# Patient Record
Sex: Male | Born: 1990 | Race: Black or African American | Hispanic: No | Marital: Single | Smoking: Never smoker
Health system: Southern US, Community
[De-identification: ages and names within clinical notes are randomized; demographics above are authoritative.]

## PROBLEM LIST (undated history)

## (undated) DIAGNOSIS — S62309A Unspecified fracture of unspecified metacarpal bone, initial encounter for closed fracture: Secondary | ICD-10-CM

---
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
## 2010-05-12 LAB — RAPID STREP SCREEN (MED CTR MEBANE ONLY): Streptococcus, Group A Screen (Direct): NEGATIVE

## 2011-02-11 DIAGNOSIS — S62309A Unspecified fracture of unspecified metacarpal bone, initial encounter for closed fracture: Secondary | ICD-10-CM

## 2011-02-11 HISTORY — DX: Unspecified fracture of unspecified metacarpal bone, initial encounter for closed fracture: S62.309A

## 2011-02-12 HISTORY — PX: ELBOW SURGERY: SHX618

## 2011-02-24 ENCOUNTER — Other Ambulatory Visit: Payer: Self-pay | Admitting: Orthopedic Surgery

## 2011-02-27 ENCOUNTER — Encounter (HOSPITAL_BASED_OUTPATIENT_CLINIC_OR_DEPARTMENT_OTHER): Payer: Self-pay | Admitting: *Deleted

## 2011-03-01 ENCOUNTER — Encounter (HOSPITAL_BASED_OUTPATIENT_CLINIC_OR_DEPARTMENT_OTHER): Payer: Self-pay | Admitting: *Deleted

## 2011-03-01 ENCOUNTER — Ambulatory Visit (HOSPITAL_BASED_OUTPATIENT_CLINIC_OR_DEPARTMENT_OTHER)
Admission: RE | Admit: 2011-03-01 | Discharge: 2011-03-01 | Disposition: A | Discharge status: Home / Self Care | Payer: BC Managed Care – PPO | Source: Ambulatory Visit | Attending: Orthopedic Surgery | Admitting: Orthopedic Surgery

## 2011-03-01 ENCOUNTER — Encounter (HOSPITAL_BASED_OUTPATIENT_CLINIC_OR_DEPARTMENT_OTHER)
Admission: RE | Disposition: A | Discharge status: Home / Self Care | Payer: Self-pay | Source: Ambulatory Visit | Attending: Orthopedic Surgery

## 2011-03-01 ENCOUNTER — Ambulatory Visit (HOSPITAL_BASED_OUTPATIENT_CLINIC_OR_DEPARTMENT_OTHER): Payer: BC Managed Care – PPO | Admitting: *Deleted

## 2011-03-01 DIAGNOSIS — S42493A Other displaced fracture of lower end of unspecified humerus, initial encounter for closed fracture: Secondary | ICD-10-CM | POA: Insufficient documentation

## 2011-03-01 DIAGNOSIS — S62309A Unspecified fracture of unspecified metacarpal bone, initial encounter for closed fracture: Secondary | ICD-10-CM | POA: Insufficient documentation

## 2011-03-01 DIAGNOSIS — S52123A Displaced fracture of head of unspecified radius, initial encounter for closed fracture: Secondary | ICD-10-CM | POA: Insufficient documentation

## 2011-03-01 HISTORY — PX: ORIF FINGER FRACTURE: SHX2122

## 2011-03-01 HISTORY — DX: Unspecified fracture of unspecified metacarpal bone, initial encounter for closed fracture: S62.309A

## 2011-03-01 LAB — POCT HEMOGLOBIN-HEMACUE: Hemoglobin: 11.7 g/dL — ABNORMAL LOW (ref 13.0–17.0)

## 2011-03-01 SURGERY — OPEN REDUCTION INTERNAL FIXATION (ORIF) METACARPAL (FINGER) FRACTURE
Anesthesia: General | Site: Hand | Laterality: Left | Wound class: Clean

## 2011-03-01 MED ORDER — FENTANYL CITRATE 0.05 MG/ML IJ SOLN: 25.0000 ug | INTRAMUSCULAR | Status: DC | PRN

## 2011-03-01 MED ORDER — FENTANYL CITRATE 0.05 MG/ML IJ SOLN
50.0000 ug | INTRAMUSCULAR | Status: DC | PRN
  Administered 2011-03-01: 100 ug via INTRAVENOUS

## 2011-03-01 MED ORDER — LACTATED RINGERS IV SOLN
INTRAVENOUS | Status: DC
Start: 2011-03-01 — End: 2011-03-01
  Administered 2011-03-01 (×2): via INTRAVENOUS

## 2011-03-01 MED ORDER — BUPIVACAINE HCL (PF) 0.5 % IJ SOLN
INTRAMUSCULAR | Status: DC | PRN
  Administered 2011-03-01: 15 mL

## 2011-03-01 MED ORDER — OXYCODONE-ACETAMINOPHEN 5-325 MG PO TABS: 1.0000 | ORAL_TABLET | ORAL | Status: DC | PRN

## 2011-03-01 MED ORDER — CHLORHEXIDINE GLUCONATE 4 % EX LIQD: 60.0000 mL | Freq: Once | CUTANEOUS | Status: DC

## 2011-03-01 MED ORDER — METOCLOPRAMIDE HCL 5 MG/ML IJ SOLN: 10.0000 mg | Freq: Once | INTRAMUSCULAR | Status: DC | PRN

## 2011-03-01 MED ORDER — ONDANSETRON HCL 4 MG/2ML IJ SOLN
INTRAMUSCULAR | Status: DC | PRN
  Administered 2011-03-01: 4 mg via INTRAVENOUS

## 2011-03-01 MED ORDER — MIDAZOLAM HCL 2 MG/2ML IJ SOLN
0.5000 mg | INTRAMUSCULAR | Status: DC | PRN
  Administered 2011-03-01: 2 mg via INTRAVENOUS

## 2011-03-01 MED ORDER — PROPOFOL 10 MG/ML IV EMUL
INTRAVENOUS | Status: DC | PRN
  Administered 2011-03-01: 250 mg via INTRAVENOUS

## 2011-03-01 MED ORDER — MORPHINE SULFATE 2 MG/ML IJ SOLN: 0.0500 mg/kg | INTRAMUSCULAR | Status: DC | PRN

## 2011-03-01 MED ORDER — DEXAMETHASONE SODIUM PHOSPHATE 10 MG/ML IJ SOLN
INTRAMUSCULAR | Status: DC | PRN
  Administered 2011-03-01: 10 mg via INTRAVENOUS

## 2011-03-01 MED ORDER — LIDOCAINE-EPINEPHRINE 1.5-1:200000 % IJ SOLN
INTRAMUSCULAR | Status: DC | PRN
  Administered 2011-03-01: 15 mL via INTRADERMAL

## 2011-03-01 MED ORDER — LIDOCAINE HCL (PF) 1 % IJ SOLN
INTRAMUSCULAR | Status: DC | PRN
  Administered 2011-03-01: 2 mL

## 2011-03-01 MED ORDER — CEFAZOLIN SODIUM 1-5 GM-% IV SOLN
1.0000 g | INTRAVENOUS | Status: AC
  Administered 2011-03-01: 1 g via INTRAVENOUS

## 2011-03-01 SURGICAL SUPPLY — 83 items
1.1MM DRILL BIT QUICK CONNECT ×2 IMPLANT
APL SKNCLS STERI-STRIP NONHPOA (GAUZE/BANDAGES/DRESSINGS)
BANDAGE ACE 4 STERILE (GAUZE/BANDAGES/DRESSINGS) ×2 IMPLANT
BANDAGE ELASTIC 3 VELCRO ST LF (GAUZE/BANDAGES/DRESSINGS) ×2 IMPLANT
BANDAGE ELASTIC 4 VELCRO ST LF (GAUZE/BANDAGES/DRESSINGS) ×4 IMPLANT
BANDAGE GAUZE ELAST BULKY 4 IN (GAUZE/BANDAGES/DRESSINGS) ×4 IMPLANT
BENZOIN TINCTURE PRP APPL 2/3 (GAUZE/BANDAGES/DRESSINGS) IMPLANT
BIT DRILL MINI LNG ACUTRAK 2 (BIT) ×1 IMPLANT
BLADE SURG 15 STRL LF DISP TIS (BLADE) ×1 IMPLANT
BLADE SURG 15 STRL SS (BLADE) ×2
BNDG CMPR 9X4 STRL LF SNTH (GAUZE/BANDAGES/DRESSINGS) ×1
BNDG CMPR MD 5X2 ELC HKLP STRL (GAUZE/BANDAGES/DRESSINGS)
BNDG ELASTIC 2 VLCR STRL LF (GAUZE/BANDAGES/DRESSINGS) IMPLANT
BNDG ESMARK 4X9 LF (GAUZE/BANDAGES/DRESSINGS) ×2 IMPLANT
BONE CHIP PRESERV 5CC PCAN5 (Bone Implant) ×2 IMPLANT
CANISTER SUCTION 1200CC (MISCELLANEOUS) ×2 IMPLANT
CLOTH BEACON ORANGE TIMEOUT ST (SAFETY) ×2 IMPLANT
CORDS BIPOLAR (ELECTRODE) ×2 IMPLANT
COVER SURGICAL LIGHT HANDLE (MISCELLANEOUS) ×4 IMPLANT
COVER TABLE BACK 60X90 (DRAPES) ×2 IMPLANT
CUFF TOURNIQUET SINGLE 18IN (TOURNIQUET CUFF) IMPLANT
CUFF TOURNIQUET SINGLE 24IN (TOURNIQUET CUFF) ×2 IMPLANT
DECANTER SPIKE VIAL GLASS SM (MISCELLANEOUS) IMPLANT
DRAPE EXTREMITY T 121X128X90 (DRAPE) ×2 IMPLANT
DRAPE OEC MINIVIEW 54X84 (DRAPES) ×2 IMPLANT
DRAPE SURG 17X23 STRL (DRAPES) ×2 IMPLANT
DRILL MINI LNG ACUTRAK 2 (BIT) ×2
DURAPREP 26ML APPLICATOR (WOUND CARE) ×2 IMPLANT
GAUZE SPONGE 4X4 16PLY XRAY LF (GAUZE/BANDAGES/DRESSINGS) ×2 IMPLANT
GAUZE XEROFORM 1X8 LF (GAUZE/BANDAGES/DRESSINGS) ×2 IMPLANT
GLOVE BIO SURGEON STRL SZ 6.5 (GLOVE) ×4 IMPLANT
GLOVE BIO SURGEON STRL SZ8.5 (GLOVE) ×4 IMPLANT
GLOVE BIOGEL PI IND STRL 7.0 (GLOVE) ×1 IMPLANT
GLOVE BIOGEL PI INDICATOR 7.0 (GLOVE) ×1
GLOVE INDICATOR 7.0 STRL GRN (GLOVE) ×2 IMPLANT
GOWN PREVENTION PLUS XLARGE (GOWN DISPOSABLE) ×2 IMPLANT
GOWN PREVENTION PLUS XXLARGE (GOWN DISPOSABLE) ×2 IMPLANT
GUIDEWIRE MINI ×6 IMPLANT
NEEDLE HYPO 25X1 1.5 SAFETY (NEEDLE) IMPLANT
NS IRRIG 1000ML POUR BTL (IV SOLUTION) IMPLANT
PACK BASIN DAY SURGERY FS (CUSTOM PROCEDURE TRAY) ×2 IMPLANT
PAD CAST 3X4 CTTN HI CHSV (CAST SUPPLIES) ×2 IMPLANT
PAD CAST 4YDX4 CTTN HI CHSV (CAST SUPPLIES) ×2 IMPLANT
PADDING CAST ABS 4INX4YD NS (CAST SUPPLIES) ×2
PADDING CAST ABS COTTON 4X4 ST (CAST SUPPLIES) ×2 IMPLANT
PADDING CAST COTTON 3X4 STRL (CAST SUPPLIES) ×4
PADDING CAST COTTON 4X4 STRL (CAST SUPPLIES) ×4
PADDING UNDERCAST 2  STERILE (CAST SUPPLIES) IMPLANT
PADDING WEBRIL 3 STERILE (GAUZE/BANDAGES/DRESSINGS) ×2 IMPLANT
PADDING WEBRIL 4 STERILE (GAUZE/BANDAGES/DRESSINGS) ×2 IMPLANT
PLATE T SMALL 1.5MM (Plate) ×2 IMPLANT
SCREW 1.5X18MM (Screw) ×2 IMPLANT
SCREW ACUTRAK 2 MINI 28MM (Screw) ×2 IMPLANT
SCREW ACUTRAK 2 MINI 30MM (Screw) ×2 IMPLANT
SCREW BN 18X1.5XST NONLOCK (Screw) ×1 IMPLANT
SCREW NL 1.5X11 WRIST (Screw) ×8 IMPLANT
SCREW NL 1.5X12 (Screw) ×2 IMPLANT
SCREW NL 1.5X13 (Screw) ×2 IMPLANT
SHEET MEDIUM DRAPE 40X70 STRL (DRAPES) ×2 IMPLANT
SLING ARM FOAM STRAP LRG (SOFTGOODS) ×2 IMPLANT
SPLINT PLASTER CAST XFAST 4X15 (CAST SUPPLIES) ×20 IMPLANT
SPLINT PLASTER X FASTSET 4X15 (CAST SUPPLIES) ×2 IMPLANT
SPLINT PLASTER XTRA FAST SET 4 (CAST SUPPLIES) ×20
SPONGE GAUZE 4X4 12PLY (GAUZE/BANDAGES/DRESSINGS) ×4 IMPLANT
STOCKINETTE 4X48 STRL (DRAPES) ×2 IMPLANT
STRIP CLOSURE SKIN 1/2X4 (GAUZE/BANDAGES/DRESSINGS) IMPLANT
SUCTION FRAZIER TIP 10 FR DISP (SUCTIONS) IMPLANT
SUT ETHILON 4 0 PS 2 18 (SUTURE) IMPLANT
SUT ETHILON 5 0 PS 2 18 (SUTURE) IMPLANT
SUT FIBERWIRE 2-0 18 17.9 3/8 (SUTURE) ×4
SUT MERSILENE 4 0 P 3 (SUTURE) IMPLANT
SUT VIC AB 2-0 SH 27 (SUTURE) ×2
SUT VIC AB 2-0 SH 27XBRD (SUTURE) ×1 IMPLANT
SUT VIC AB 4-0 P-3 18XBRD (SUTURE) ×1 IMPLANT
SUT VIC AB 4-0 P3 18 (SUTURE) ×2
SUT VICRYL RAPIDE 4/0 PS 2 (SUTURE) ×2 IMPLANT
SUTURE FIBERWR 2-0 18 17.9 3/8 (SUTURE) ×2 IMPLANT
SYR BULB 3OZ (MISCELLANEOUS) ×2 IMPLANT
SYRINGE 10CC LL (SYRINGE) IMPLANT
TOWEL OR 17X24 6PK STRL BLUE (TOWEL DISPOSABLE) ×2 IMPLANT
TUBE CONNECTING 20X1/4 (TUBING) IMPLANT
UNDERPAD 30X30 INCONTINENT (UNDERPADS AND DIAPERS) ×2 IMPLANT
WATER STERILE IRR 1000ML POUR (IV SOLUTION) ×2 IMPLANT

## 2011-03-01 NOTE — Brief Op Note (Signed)
03/01/2011  3:06 PM  PATIENT:  Alan Allen  21 y.o. male  PRE-OPERATIVE DIAGNOSIS:  left ring metacarpal fracture and left capitellum  POST-OPERATIVE DIAGNOSIS:  left ring metacarpal fracture and leftcapitellum   PROCEDURE:  Procedure(s): OPEN REDUCTION INTERNAL FIXATION (ORIF) METACARPAL (FINGER) FRACTURE  SURGEON:  Surgeon(s): Marlowe Shores, MD Nicki Reaper, MD  PHYSICIAN ASSISTANT:   ASSISTANTSMerlyn Lot, gary   ANESTHESIA:   general  EBL:  Total I/O In: 1000 [I.V.:1000] Out: -   BLOOD ADMINISTERED:none  DRAINS: none   LOCAL MEDICATIONS USED:  NONE  SPECIMEN:  No Specimen  DISPOSITION OF SPECIMEN:  N/A  COUNTS:  YES  TOURNIQUET:   Total Tourniquet Time Documented: Upper Arm (Left) - 91 minutes  DICTATION: .Other Dictation: Dictation Number [id number]  PLAN OF CARE: Discharge to home after PACU  PATIENT DISPOSITION:  PACU - hemodynamically stable.   Delay start of Pharmacological VTE agent (>24hrs) due to surgical blood loss or risk of bleeding:  {YES/NO/NOT APPLICABLE:20182

## 2011-03-01 NOTE — Progress Notes (Signed)
Assisted Dr. Frederick with left, ultrasound guided, supraclavicular block. Side rails up, monitors on throughout procedure. See vital signs in flow sheet. Tolerated Procedure well. 

## 2011-03-01 NOTE — Anesthesia Procedure Notes (Addendum)
Anesthesia Regional Block:  Supraclavicular block  Pre-Anesthetic Checklist: ,, timeout performed, Correct Patient, Correct Site, Correct Laterality, Correct Procedure, Correct Position, site marked, Risks and benefits discussed,  Surgical consent,  Pre-op evaluation,  At surgeon's request and post-op pain management  Laterality: Left  Prep: chloraprep       Needles:   Needle Type: Other   (Arrow Echogenic)   Needle Length: 9cm  Needle Gauge: 21    Additional Needles:  Procedures: ultrasound guided Supraclavicular block Narrative:  Start time: 03/01/2011 12:48 PM End time: 03/01/2011 12:54 PM Injection made incrementally with aspirations every 5 mL.  Performed by: Personally  Anesthesiologist: C Frederick  Additional Notes: Ultrasound guidance used to: id relevant anatomy, confirm needle position, local anesthetic spread, avoidance of vascular puncture. Picture saved. No complications. Block performed personally by Janetta Hora. Gelene Mink, MD    Supraclavicular block Procedure Name: LMA Insertion Date/Time: 03/01/2011 1:04 PM Performed by: Jearld Shines Pre-anesthesia Checklist: Patient identified, Emergency Drugs available, Suction available and Patient being monitored Patient Re-evaluated:Patient Re-evaluated prior to inductionOxygen Delivery Method: Circle System Utilized Preoxygenation: Pre-oxygenation with 100% oxygen Intubation Type: IV induction Ventilation: Mask ventilation without difficulty LMA: LMA inserted LMA Size: 5.0 Number of attempts: 1 Airway Equipment and Method: bite block Placement Confirmation: positive ETCO2 and breath sounds checked- equal and bilateral Tube secured with: Tape Dental Injury: Teeth and Oropharynx as per pre-operative assessment

## 2011-03-01 NOTE — Transfer of Care (Signed)
Immediate Anesthesia Transfer of Care Note  Patient: Alan Allen  Procedure(s) Performed:  OPEN REDUCTION INTERNAL FIXATION (ORIF) METACARPAL (FINGER) FRACTURE - left ring metacarpal fracture and left capitellum  Patient Location: PACU  Anesthesia Type: GA combined with regional for post-op pain  Level of Consciousness: sedated  Airway & Oxygen Therapy: Patient Spontanous Breathing and Patient connected to face mask oxygen  Post-op Assessment: Report given to PACU RN and Post -op Vital signs reviewed and stable  Post vital signs: Reviewed and stable  Complications: No apparent anesthesia complications

## 2011-03-01 NOTE — Anesthesia Postprocedure Evaluation (Signed)
Anesthesia Post Note  Patient: Alan Allen  Procedure(s) Performed:  OPEN REDUCTION INTERNAL FIXATION (ORIF) METACARPAL (FINGER) FRACTURE - left ring metacarpal fracture and left capitellum  Anesthesia type: General  Patient location: PACU  Post pain: Pain level controlled  Post assessment: Patient's Cardiovascular Status Stable  Last Vitals:  Filed Vitals:   03/01/11 1600  BP: 122/82  Pulse: 89  Temp:   Resp: 12    Post vital signs: Reviewed and stable  Level of consciousness: alert  Complications: No apparent anesthesia complications
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
## 2011-03-01 NOTE — Anesthesia Preprocedure Evaluation (Addendum)
Anesthesia Evaluation  Patient identified by MRN, date of birth, ID band Patient awake    Reviewed: Allergy & Precautions, H&P , NPO status , Patient's Chart, lab work & pertinent test results, reviewed documented beta blocker date and time   Airway Mallampati: II TM Distance: >3 FB Neck ROM: full    Dental   Pulmonary neg pulmonary ROS,          Cardiovascular neg cardio ROS     Neuro/Psych Negative Neurological ROS  Negative Psych ROS   GI/Hepatic negative GI ROS, Neg liver ROS,   Endo/Other  Negative Endocrine ROS  Renal/GU negative Renal ROS  Genitourinary negative   Musculoskeletal   Abdominal   Peds  Hematology negative hematology ROS (+)   Anesthesia Other Findings See surgeon's H&P   Reproductive/Obstetrics negative OB ROS                           Anesthesia Physical Anesthesia Plan  ASA: I  Anesthesia Plan: General   Post-op Pain Management: MAC Combined w/ Regional for Post-op pain   Induction: Intravenous  Airway Management Planned: LMA  Additional Equipment:   Intra-op Plan:   Post-operative Plan: Extubation in OR  Informed Consent: I have reviewed the patients History and Physical, chart, labs and discussed the procedure including the risks, benefits and alternatives for the proposed anesthesia with the patient or authorized representative who has indicated his/her understanding and acceptance.   Dental Advisory Given  Plan Discussed with: CRNA and Surgeon  Anesthesia Plan Comments:        Anesthesia Quick Evaluation  

## 2011-03-01 NOTE — Op Note (Signed)
See dictated note [phone]

## 2011-03-02 ENCOUNTER — Encounter (HOSPITAL_BASED_OUTPATIENT_CLINIC_OR_DEPARTMENT_OTHER): Payer: Self-pay | Admitting: Orthopedic Surgery

## 2011-03-02 NOTE — Op Note (Signed)
NAME:  Alan Allen, Alan Allen                     ACCOUNT NO.:  MEDICAL RECORD NO.:  [id number]  LOCATION:                                 FACILITY:  PHYSICIAN:  Artist Pais. Mina Marble, M.D.   DATE OF BIRTH:  DATE OF PROCEDURE:  03/01/2011 DATE OF DISCHARGE:                              OPERATIVE REPORT   PREOPERATIVE DIAGNOSES:  Left ring finger metacarpal fracture and left capitellar fracture and left radial head fracture.  POSTOPERATIVE DIAGNOSES:  Left ring finger metacarpal fracture and left capitellar fracture and left radial head fracture.  PROCEDURE:  Hardware removal, left hand, left elbow with open reduction and internal fixation and cancellous bone grafting of capitellar shear fracture, closed treatment of radial head fracture and open reduction internal fixation of left ring finger metacarpal fracture.  SURGEON:  Artist Pais. Mina Marble, MD and Merlyn Lot.  ANESTHESIA:  General.  No complication.  No drains.  The patient was taken to the operating suite.  After induction of general anesthesia, left upper extremity was prepped and draped in sterile fashion.  An Esmarch was used to exsanguinate the limb. Tourniquet inflated to 250 mmHg.  At this point in time, two 45 K-wires that were used to provisionally fix a metacarpal fracture in the left hand were removed as well as three 62 K-wires that were placed for capitellar fracture fixation at an outside hospital and staples were removed using the old incision.  Lateral approach was taken to the elbow.  The lateral aspect of the elbow was opened using the previous surgeon's muscular split revealing an impacted radial head fracture and a shear fracture of the capitellum with significant displacement and bone loss.  The fracture site was taken down.  The capitellar shear fracture was then reduced with manual pressure and provisionally fixed with mini Acutrak guide pins.  Intraoperative fluoroscopy revealed adequate reduction.  Two mini  Acutrak screws were placed from anterior to posterior across the capitellum, 130 mm and 128 mm.  Intraoperative fluoroscopy revealed adequate reduction AP, lateral , and oblique view. The wound was thoroughly irrigated.  The lateral aspect was repaired using 2-0 FiberWire suture in figure-of-eight sutures, followed by 3-0 undyed Vicryl subcutaneously and staples on the skin.  Intraoperative x- rays showed good reduction.  There was cancellous graft placed where there was a significant loss of bone laterally at the lateral condylar area.  Once this was completed, an incision was made over the ring metacarpal.  Skin was incised sharply.  EDC tendons retracted. Subperiosteal dissection was undertaken revealed a fracture site at the metaphysis diaphyseal flare.  It was fixed with a 1.5 mm ALPS modular handset T-plate with cortical screws proximal and distal under fluoroscopic and direct visualization.  This was closed with 4-0 Vicryl for the periosteum and a 4-0 Vicryl Rapide suture on the skin.  Xeroform, 4x4s, fluffs, and a compressive dressing was applied with the elbow flexed 90 degrees, forearm in neutral and wrist in neutral.  The patient tolerated the  procedures well, went to recovery room in stable fashion.     Artist Pais Mina Marble, M.D.     MAW/MEDQ  D:  03/01/2011  T:  03/02/2011  Job:  [id number]

## 2011-03-07 ENCOUNTER — Encounter (HOSPITAL_COMMUNITY): Payer: Self-pay | Admitting: *Deleted

## 2011-03-07 ENCOUNTER — Emergency Department (HOSPITAL_COMMUNITY)
Admission: EM | Admit: 2011-03-07 | Discharge: 2011-03-08 | Disposition: A | Discharge status: Home / Self Care | Payer: BC Managed Care – PPO | Attending: Emergency Medicine | Admitting: Emergency Medicine

## 2011-03-07 DIAGNOSIS — K5900 Constipation, unspecified: Secondary | ICD-10-CM | POA: Insufficient documentation

## 2011-03-07 DIAGNOSIS — K5641 Fecal impaction: Secondary | ICD-10-CM | POA: Insufficient documentation

## 2011-03-07 DIAGNOSIS — R109 Unspecified abdominal pain: Secondary | ICD-10-CM | POA: Insufficient documentation

## 2011-03-07 NOTE — ED Notes (Signed)
No bm for 3 weeks .  No nv
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
## 2011-03-08 MED ORDER — POLYETHYLENE GLYCOL 3350 17 GM/SCOOP PO POWD: 17.0000 g | Freq: Every day | ORAL | Status: AC

## 2011-03-08 MED ORDER — DISPOSABLE ENEMA 19-7 GM/118ML RE ENEM: 1.0000 | ENEMA | Freq: Once | RECTAL | Status: AC
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
## 2014-04-27 ENCOUNTER — Emergency Department (HOSPITAL_COMMUNITY)
Admission: EM | Admit: 2014-04-27 | Discharge: 2014-04-27 | Disposition: A | Discharge status: Home / Self Care | Payer: Self-pay | Attending: Emergency Medicine | Admitting: Emergency Medicine

## 2014-04-27 ENCOUNTER — Emergency Department (HOSPITAL_COMMUNITY): Payer: Self-pay

## 2014-04-27 ENCOUNTER — Encounter (HOSPITAL_COMMUNITY): Payer: Self-pay | Admitting: *Deleted

## 2014-04-27 DIAGNOSIS — X58XXXA Exposure to other specified factors, initial encounter: Secondary | ICD-10-CM | POA: Insufficient documentation

## 2014-04-27 DIAGNOSIS — Y939 Activity, unspecified: Secondary | ICD-10-CM | POA: Insufficient documentation

## 2014-04-27 DIAGNOSIS — Y929 Unspecified place or not applicable: Secondary | ICD-10-CM | POA: Insufficient documentation

## 2014-04-27 DIAGNOSIS — Y999 Unspecified external cause status: Secondary | ICD-10-CM | POA: Insufficient documentation

## 2014-04-27 DIAGNOSIS — S93401A Sprain of unspecified ligament of right ankle, initial encounter: Secondary | ICD-10-CM | POA: Insufficient documentation
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
## 2014-04-27 NOTE — Discharge Instructions (Signed)

## 2014-04-27 NOTE — ED Notes (Signed)
Pt rolled his right ankle before work [employer]. Pt states he tried to work through the pain but it was too much.

## 2014-07-12 ENCOUNTER — Emergency Department (HOSPITAL_COMMUNITY)
Admission: EM | Admit: 2014-07-12 | Discharge: 2014-07-12 | Disposition: A | Discharge status: Home / Self Care | Payer: BLUE CROSS/BLUE SHIELD | Attending: Emergency Medicine | Admitting: Emergency Medicine

## 2014-07-12 ENCOUNTER — Encounter (HOSPITAL_COMMUNITY): Payer: Self-pay | Admitting: Emergency Medicine

## 2014-07-12 DIAGNOSIS — K0889 Other specified disorders of teeth and supporting structures: Secondary | ICD-10-CM

## 2014-07-12 DIAGNOSIS — K088 Other specified disorders of teeth and supporting structures: Secondary | ICD-10-CM | POA: Diagnosis present

## 2014-07-12 DIAGNOSIS — Z8781 Personal history of (healed) traumatic fracture: Secondary | ICD-10-CM | POA: Insufficient documentation

## 2014-07-12 MED ORDER — NAPROXEN 500 MG PO TABS
500.0000 mg | ORAL_TABLET | Freq: Once | ORAL | Status: AC
  Administered 2014-07-12: 500 mg via ORAL
  Filled 2014-07-12: qty 1

## 2014-07-12 MED ORDER — NAPROXEN 500 MG PO TABS: 500.0000 mg | ORAL_TABLET | Freq: Two times a day (BID) | ORAL | Status: DC

## 2014-07-12 MED ORDER — PENICILLIN V POTASSIUM 500 MG PO TABS: 500.0000 mg | ORAL_TABLET | Freq: Four times a day (QID) | ORAL | Status: DC

## 2014-07-12 MED ORDER — PENICILLIN V POTASSIUM 500 MG PO TABS
500.0000 mg | ORAL_TABLET | Freq: Once | ORAL | Status: AC
  Administered 2014-07-12: 500 mg via ORAL
  Filled 2014-07-12: qty 1

## 2014-07-12 NOTE — ED Notes (Signed)
Pt c/o R upper tooth pain x 1 week with facial swelling that started yesterday. Pt was going to make an appointment with the dentist tomorrow but was in too much pain to wait until then. Pt A&Ox4.
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
## 2014-07-12 NOTE — Discharge Instructions (Signed)
Take veetid as directed until gone. Take naprosyn as needed for pain. Refer to attached documents for more information.

## 2018-01-08 ENCOUNTER — Encounter (HOSPITAL_COMMUNITY): Payer: Self-pay | Admitting: Emergency Medicine

## 2018-01-08 ENCOUNTER — Emergency Department (HOSPITAL_COMMUNITY)
Admission: EM | Admit: 2018-01-08 | Discharge: 2018-01-09 | Disposition: A | Discharge status: Home / Self Care | Payer: BLUE CROSS/BLUE SHIELD | Attending: Emergency Medicine | Admitting: Emergency Medicine

## 2018-01-08 DIAGNOSIS — J020 Streptococcal pharyngitis: Secondary | ICD-10-CM | POA: Insufficient documentation

## 2018-01-08 NOTE — ED Triage Notes (Signed)
Pt report a sore throat that is so bad "it hurts to swallow".  Pt is able to speak in complete sentences.

## 2018-01-09 LAB — GROUP A STREP BY PCR: Group A Strep by PCR: DETECTED — AB

## 2018-01-09 MED ORDER — DEXAMETHASONE SODIUM PHOSPHATE 10 MG/ML IJ SOLN
10.0000 mg | Freq: Once | INTRAMUSCULAR | Status: AC
  Administered 2018-01-09: 10 mg via INTRAMUSCULAR
  Filled 2018-01-09: qty 1

## 2018-01-09 MED ORDER — PENICILLIN G BENZATHINE 1200000 UNIT/2ML IM SUSP
1.2000 10*6.[IU] | Freq: Once | INTRAMUSCULAR | Status: AC
  Administered 2018-01-09: 1.2 10*6.[IU] via INTRAMUSCULAR
  Filled 2018-01-09: qty 2

## 2018-01-09 NOTE — ED Notes (Signed)
Pt stable, ambulatory, and verbalizes understanding of d/c instructions.  

## 2018-01-09 NOTE — Discharge Instructions (Signed)
You have been treated for strep throat.  Can use over the counter chloraseptic to help with throat pain. Follow-up with your primary care doctor. Return to the ED for new or worsening symptoms.
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
## 2020-09-06 ENCOUNTER — Emergency Department (HOSPITAL_COMMUNITY)
Admission: EM | Admit: 2020-09-06 | Discharge: 2020-09-06 | Disposition: A | Discharge status: Home / Self Care | Payer: Self-pay | Attending: Emergency Medicine | Admitting: Emergency Medicine

## 2020-09-06 ENCOUNTER — Encounter (HOSPITAL_COMMUNITY): Payer: Self-pay

## 2020-09-06 ENCOUNTER — Other Ambulatory Visit: Payer: Self-pay

## 2020-09-06 ENCOUNTER — Emergency Department (HOSPITAL_COMMUNITY): Payer: Self-pay

## 2020-09-06 DIAGNOSIS — Y93E5 Activity, floor mopping and cleaning: Secondary | ICD-10-CM | POA: Insufficient documentation

## 2020-09-06 DIAGNOSIS — Y92013 Bedroom of single-family (private) house as the place of occurrence of the external cause: Secondary | ICD-10-CM | POA: Insufficient documentation

## 2020-09-06 DIAGNOSIS — Y280XXA Contact with sharp glass, undetermined intent, initial encounter: Secondary | ICD-10-CM | POA: Insufficient documentation

## 2020-09-06 DIAGNOSIS — Z23 Encounter for immunization: Secondary | ICD-10-CM | POA: Insufficient documentation

## 2020-09-06 DIAGNOSIS — S91312A Laceration without foreign body, left foot, initial encounter: Secondary | ICD-10-CM | POA: Insufficient documentation

## 2020-09-06 MED ORDER — TETANUS-DIPHTH-ACELL PERTUSSIS 5-2.5-18.5 LF-MCG/0.5 IM SUSY
0.5000 mL | PREFILLED_SYRINGE | Freq: Once | INTRAMUSCULAR | Status: AC
  Administered 2020-09-06: 0.5 mL via INTRAMUSCULAR
  Filled 2020-09-06: qty 0.5

## 2020-09-06 NOTE — Discharge Instructions (Addendum)
Keep wound clean and dry.  Follow-up with your doctor as needed.  Weight-bear as tolerated.  Monitor your blood pressure and follow-up with your primary care doctor.
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
## 2020-09-06 NOTE — ED Triage Notes (Addendum)
Pt was cleaning his room today and stepped on glass cutting the bottom of his left foot.

## 2022-07-18 IMAGING — CR DG FOOT COMPLETE 3+V*L*
3 series · 3 of 3 positions shown · non-contrast
Comparison: None.

CLINICAL DATA: Stepped on glass, laceration near first metatarsal

EXAM:
LEFT FOOT - COMPLETE 3+ VIEW

[x foot ap left]
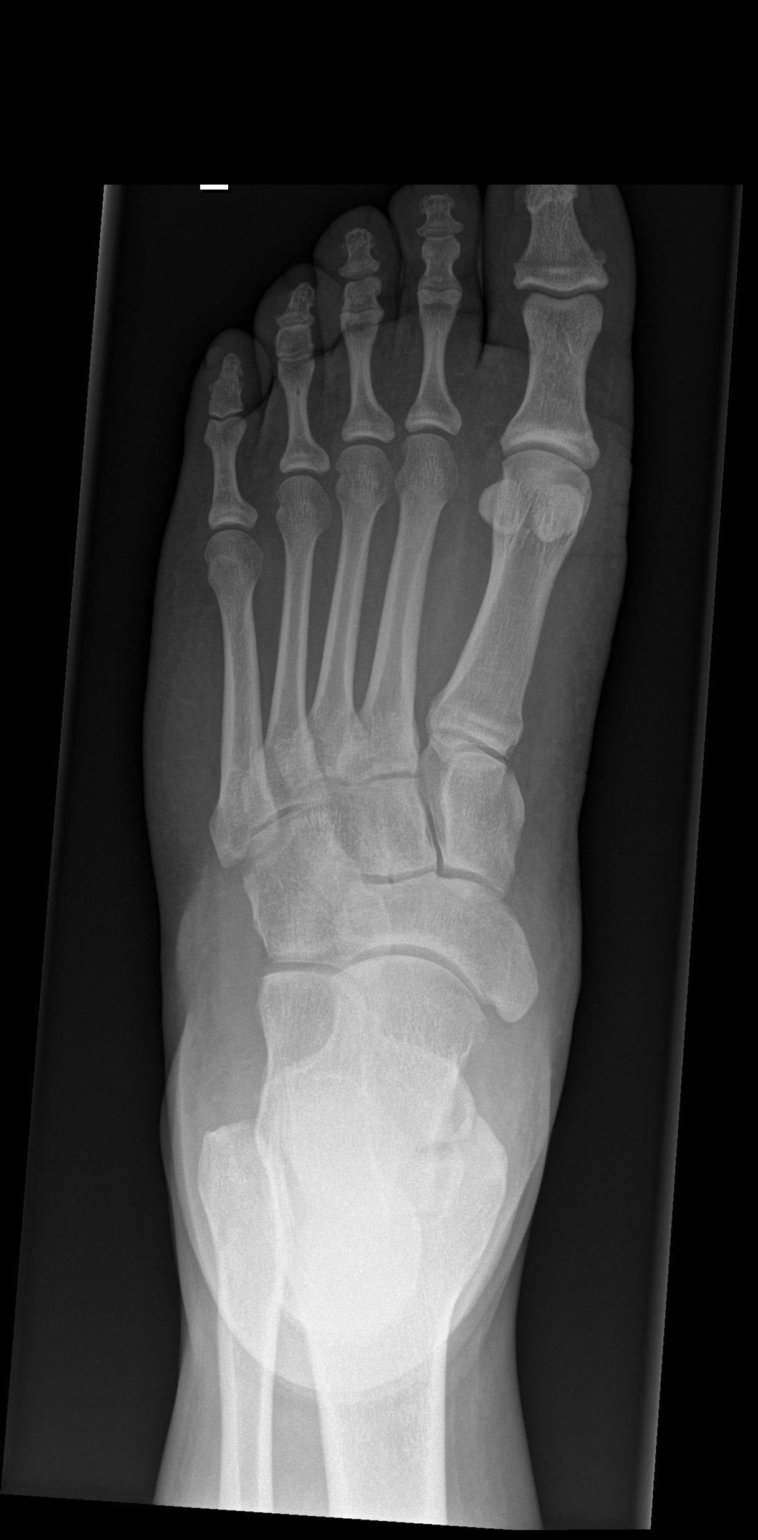

[x foot obl left]
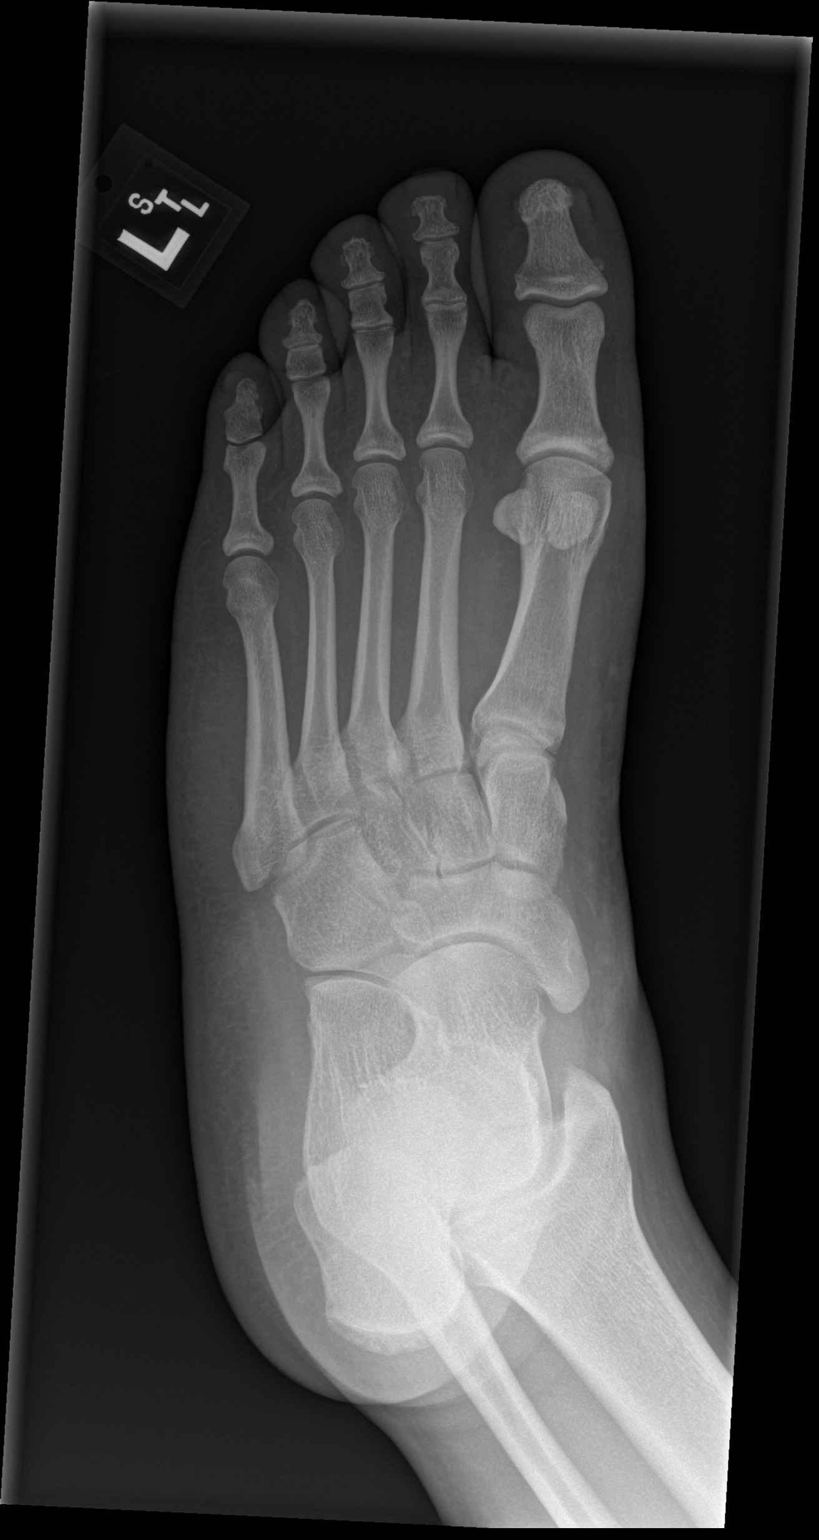

[x foot lat left]
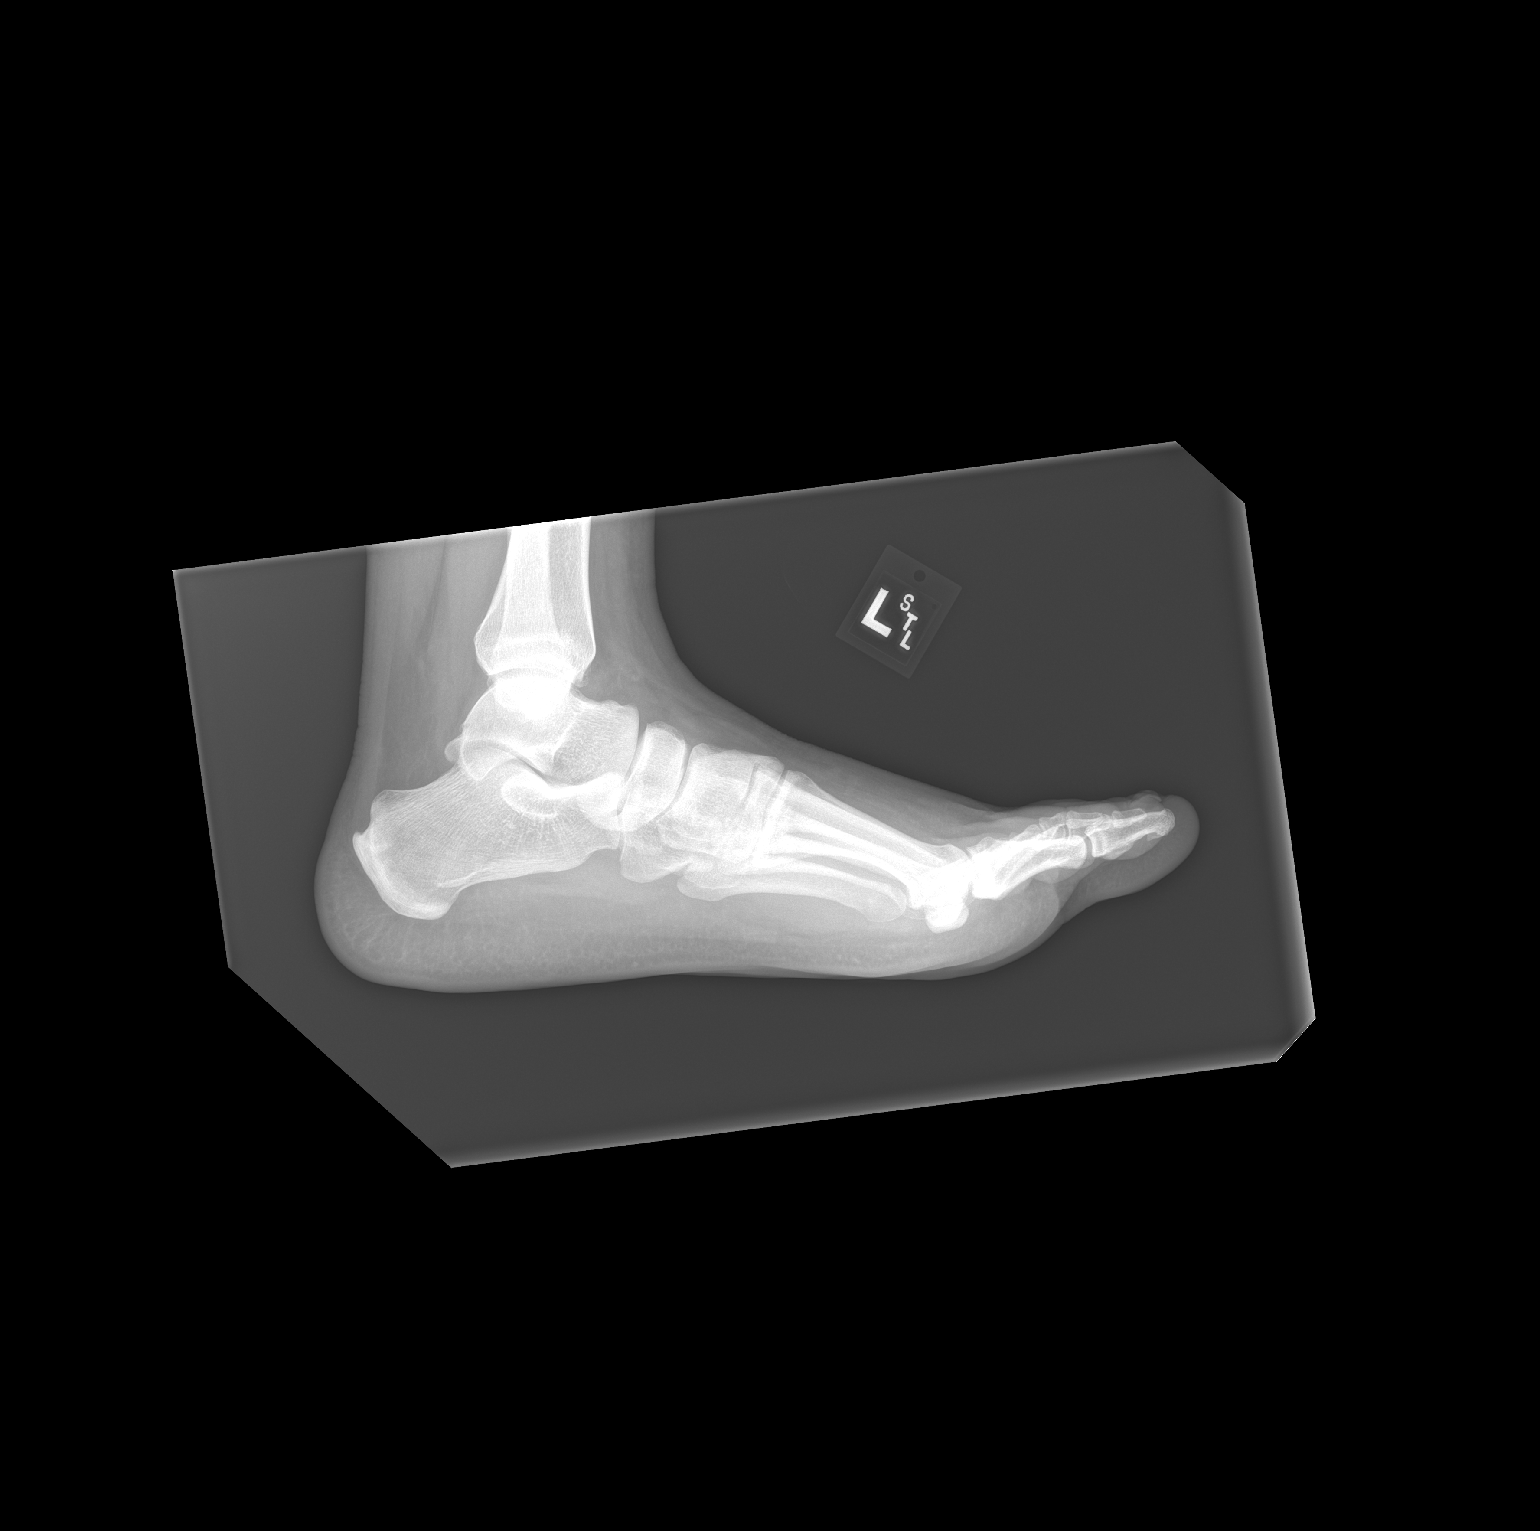

[3 of 3 positions shown; findings below may reference images not displayed]

FINDINGS: Frontal, oblique, and lateral views of the left foot are obtained.
No fracture, subluxation, or dislocation soft tissues are
unremarkable. No radiopaque foreign body.
IMPRESSION: 1. No fracture or radiopaque foreign body.

## 2023-03-07 ENCOUNTER — Encounter (HOSPITAL_BASED_OUTPATIENT_CLINIC_OR_DEPARTMENT_OTHER): Payer: Self-pay

## 2023-03-07 ENCOUNTER — Emergency Department (HOSPITAL_BASED_OUTPATIENT_CLINIC_OR_DEPARTMENT_OTHER)
Admission: EM | Admit: 2023-03-07 | Discharge: 2023-03-07 | Disposition: A | Discharge status: Home / Self Care | Payer: Self-pay | Attending: Emergency Medicine | Admitting: Emergency Medicine

## 2023-03-07 ENCOUNTER — Other Ambulatory Visit: Payer: Self-pay

## 2023-03-07 DIAGNOSIS — Z20822 Contact with and (suspected) exposure to covid-19: Secondary | ICD-10-CM | POA: Insufficient documentation

## 2023-03-07 DIAGNOSIS — J020 Streptococcal pharyngitis: Secondary | ICD-10-CM | POA: Insufficient documentation

## 2023-03-07 LAB — GROUP A STREP BY PCR: Group A Strep by PCR: DETECTED — AB

## 2023-03-07 LAB — RESP PANEL BY RT-PCR (RSV, FLU A&B, COVID)  RVPGX2
Influenza A by PCR: NEGATIVE
Influenza B by PCR: NEGATIVE
Resp Syncytial Virus by PCR: NEGATIVE
SARS Coronavirus 2 by RT PCR: NEGATIVE

## 2023-03-07 MED ORDER — DEXAMETHASONE SODIUM PHOSPHATE 10 MG/ML IJ SOLN
10.0000 mg | Freq: Once | INTRAMUSCULAR | Status: AC
  Administered 2023-03-07: 10 mg via INTRAMUSCULAR
  Filled 2023-03-07: qty 1

## 2023-03-07 MED ORDER — PENICILLIN G BENZATHINE 1200000 UNIT/2ML IM SUSY
1.2000 10*6.[IU] | PREFILLED_SYRINGE | Freq: Once | INTRAMUSCULAR | Status: AC
  Administered 2023-03-07: 1.2 10*6.[IU] via INTRAMUSCULAR
  Filled 2023-03-07: qty 2
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
## 2023-03-07 NOTE — ED Triage Notes (Signed)
Sore throat x 1 week with fever

## 2023-03-07 NOTE — Discharge Instructions (Addendum)
You were seen today and were diagnosed with strep throat.  You may return to work in 24 hours after Bicillin was administered.  Take ibuprofen or Tylenol for any pains or bodyaches.
